# Patient Record
Sex: Female | Born: 2000 | Race: White | Hispanic: No | Marital: Single | Smoking: Never smoker
Health system: Southern US, Community
[De-identification: ages and names within clinical notes are randomized; demographics above are authoritative.]

## PROBLEM LIST (undated history)

## (undated) HISTORY — PX: PANCREATECTOMY: SHX1019

---

## 2021-05-10 ENCOUNTER — Emergency Department
Admission: EM | Admit: 2021-05-10 | Discharge: 2021-05-10 | Disposition: A | Discharge status: Home / Self Care | Payer: Medicaid Other | Attending: Emergency Medicine | Admitting: Emergency Medicine

## 2021-05-10 ENCOUNTER — Emergency Department: Payer: Medicaid Other

## 2021-05-10 ENCOUNTER — Other Ambulatory Visit: Payer: Self-pay

## 2021-05-10 DIAGNOSIS — R0789 Other chest pain: Secondary | ICD-10-CM | POA: Insufficient documentation

## 2021-05-10 DIAGNOSIS — Z5321 Procedure and treatment not carried out due to patient leaving prior to being seen by health care provider: Secondary | ICD-10-CM | POA: Diagnosis not present

## 2021-05-10 DIAGNOSIS — M79602 Pain in left arm: Secondary | ICD-10-CM | POA: Insufficient documentation

## 2021-05-10 LAB — BASIC METABOLIC PANEL
Anion gap: 6 (ref 5–15)
BUN: 14 mg/dL (ref 6–20)
CO2: 28 mmol/L (ref 22–32)
Calcium: 9.9 mg/dL (ref 8.9–10.3)
Chloride: 104 mmol/L (ref 98–111)
Creatinine, Ser: 0.67 mg/dL (ref 0.44–1.00)
GFR, Estimated: >60 mL/min (ref >60)
Glucose, Bld: 115 mg/dL — ABNORMAL HIGH (ref 70–99)
Potassium: 4.1 mmol/L (ref 3.5–5.1)
Sodium: 138 mmol/L (ref 135–145)

## 2021-05-10 LAB — CBC
HCT: 41.8 % (ref 36.0–46.0)
Hemoglobin: 14.9 g/dL (ref 12.0–15.0)
MCH: 30.8 pg (ref 26.0–34.0)
MCHC: 35.6 g/dL (ref 30.0–36.0)
MCV: 86.4 fL (ref 80.0–100.0)
Platelets: 282 10*3/uL (ref 150–400)
RBC: 4.84 MIL/uL (ref 3.87–5.11)
RDW: 12.1 % (ref 11.5–15.5)
WBC: 8.8 10*3/uL (ref 4.0–10.5)
nRBC: 0 % (ref 0.0–0.2)

## 2021-05-10 LAB — TROPONIN I (HIGH SENSITIVITY): Troponin I (High Sensitivity): <2 ng/L (ref <18)

## 2021-05-10 NOTE — ED Triage Notes (Signed)
Pt states yesterday they started with pain in the L of their chest that radiated into their left arm after work- pt still having pain- pt concerned they "had a heart attack "

## 2022-01-15 IMAGING — CR DG CHEST 2V
1 series · 2 of 2 positions shown · non-contrast
Comparison: None.

CLINICAL DATA: chest pain

EXAM:
CHEST - 2 VIEW

[Series 1: dg chest 2 view · 0.14mm/px · 2 of 2 slices shown]
[im 1/2]
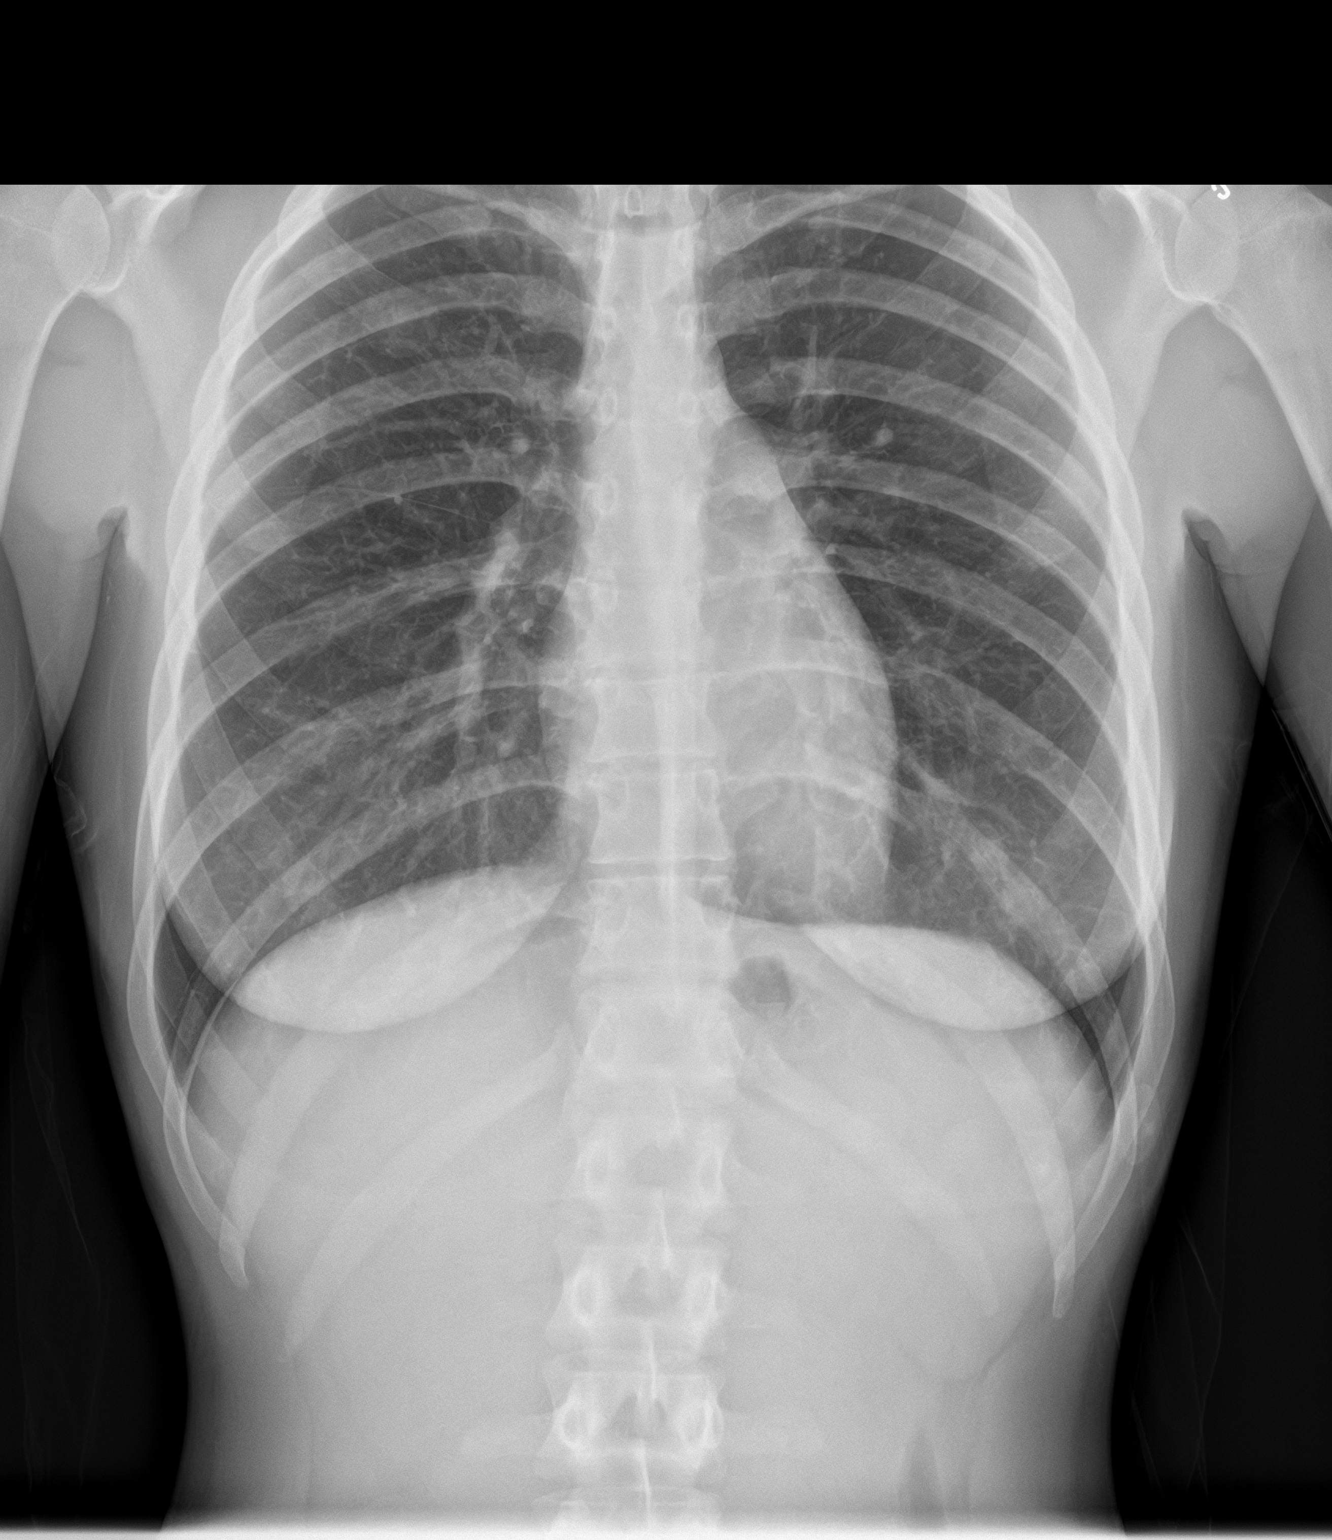
[im 2/2]
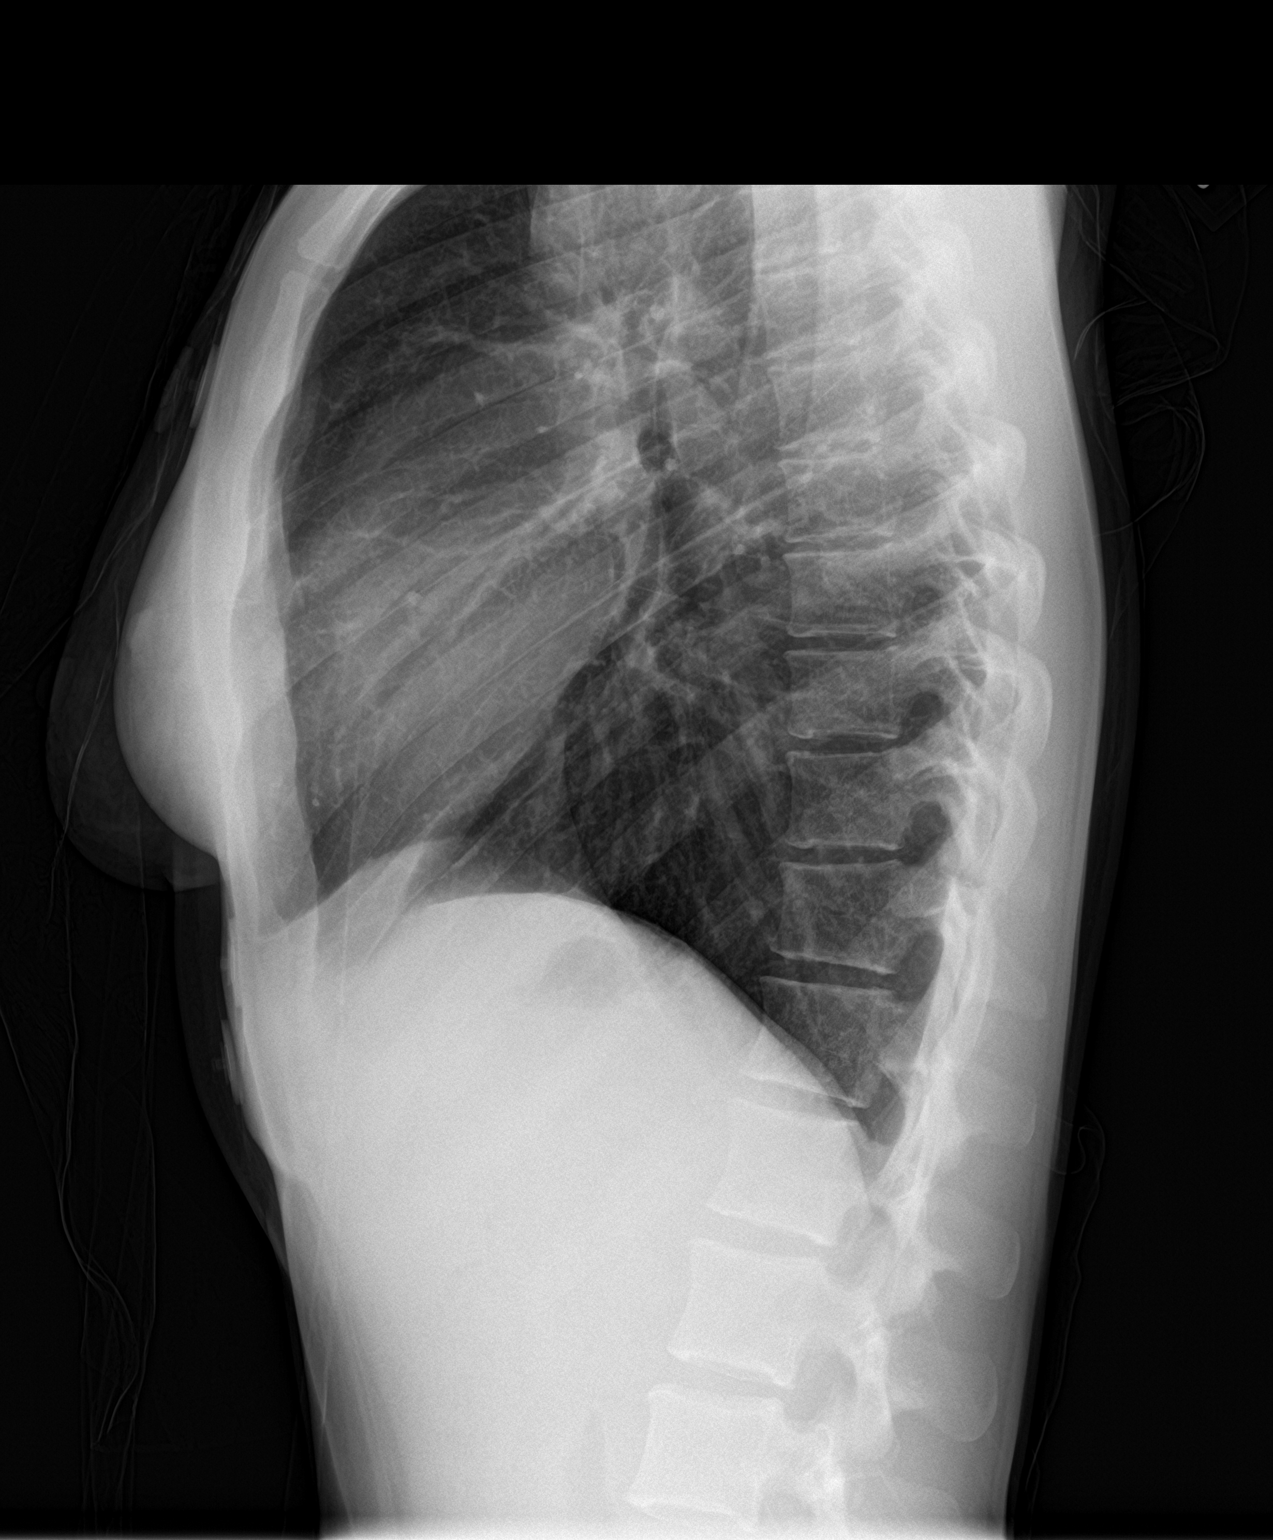

[2 of 2 positions shown; findings below may reference images not displayed]

FINDINGS: The cardiomediastinal silhouette is normal in contour. No pleural
effusion. No pneumothorax. No acute pleuroparenchymal abnormality.
Visualized abdomen is unremarkable. No acute osseous abnormality
noted.
IMPRESSION: No acute cardiopulmonary abnormality.
